# Patient Record
Sex: Male | Born: 2000 | Race: Asian | Hispanic: No | Marital: Single | Smoking: Current some day smoker
Health system: Southern US, Community
[De-identification: ages and names within clinical notes are randomized; demographics above are authoritative.]

## PROBLEM LIST (undated history)

## (undated) ENCOUNTER — Ambulatory Visit (HOSPITAL_COMMUNITY): Payer: Self-pay | Source: Home / Self Care

---
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
## 2020-10-14 IMAGING — US ULTRASOUND SCROTUM DOPPLER COMPLETE
1 series · 14 of 25 positions shown · non-contrast
Comparison: None.

CLINICAL DATA: Right-sided testicular pain

EXAM:
SCROTAL ULTRASOUND
DOPPLER ULTRASOUND OF THE TESTICLES
TECHNIQUE: Complete ultrasound examination of the testicles, epididymis, and
other scrotal structures was performed. Color and spectral Doppler
ultrasound were also utilized to evaluate blood flow to the
testicles.

[Series 1: ultrasound scrotum doppler complete · 14 of 39 slices shown]
[im 1/39]
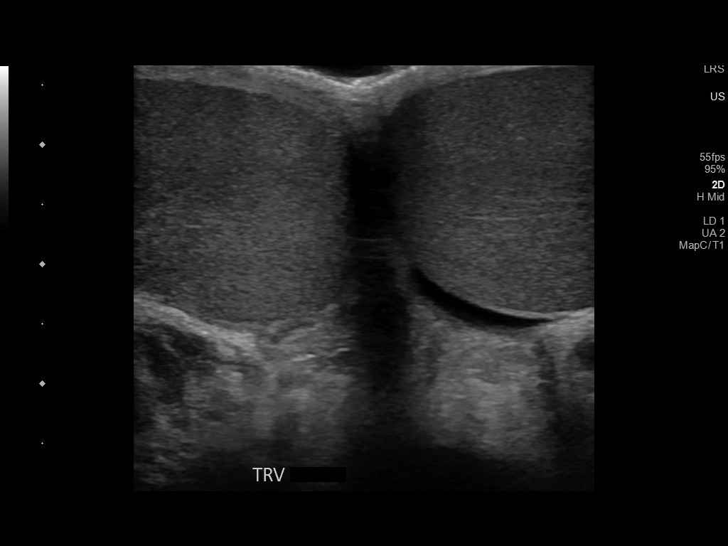
[im 4/39]
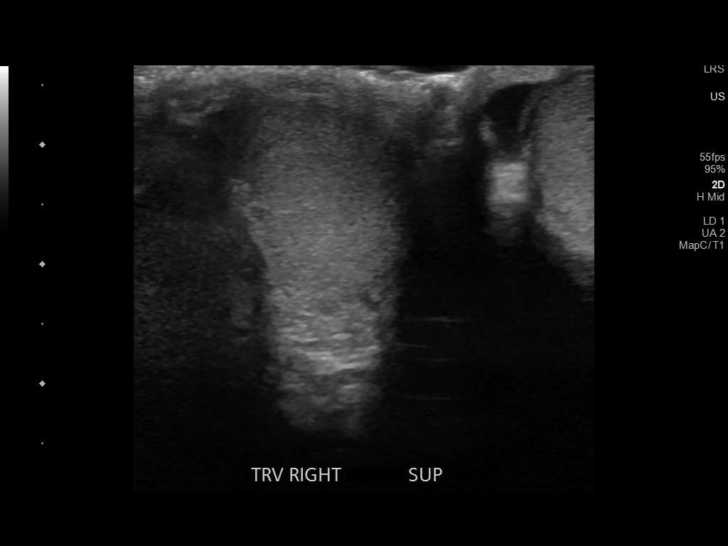
[im 7/39]
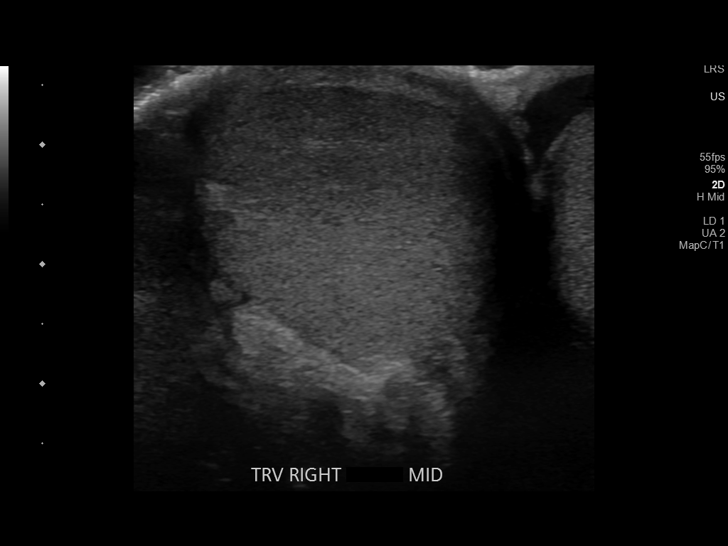
[im 10/39]
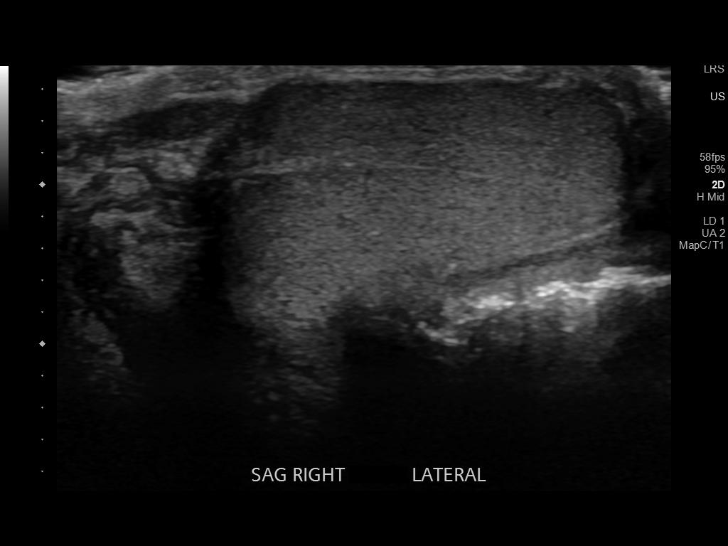
[im 13/39]
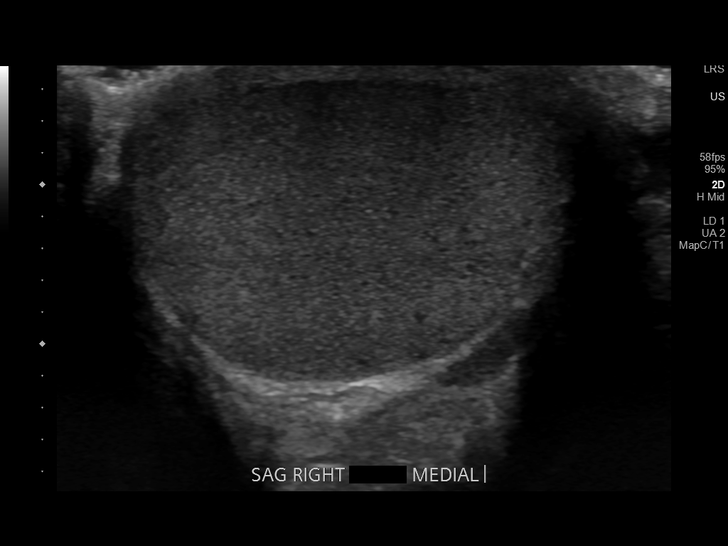
[im 15/39]
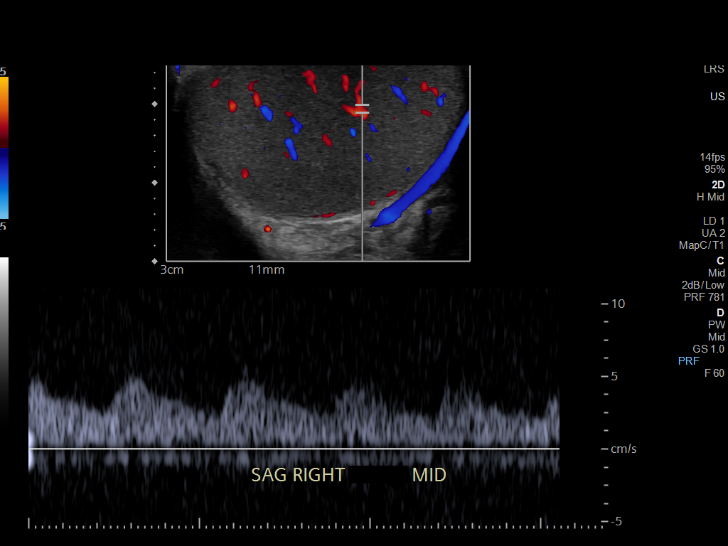
[im 18/39]
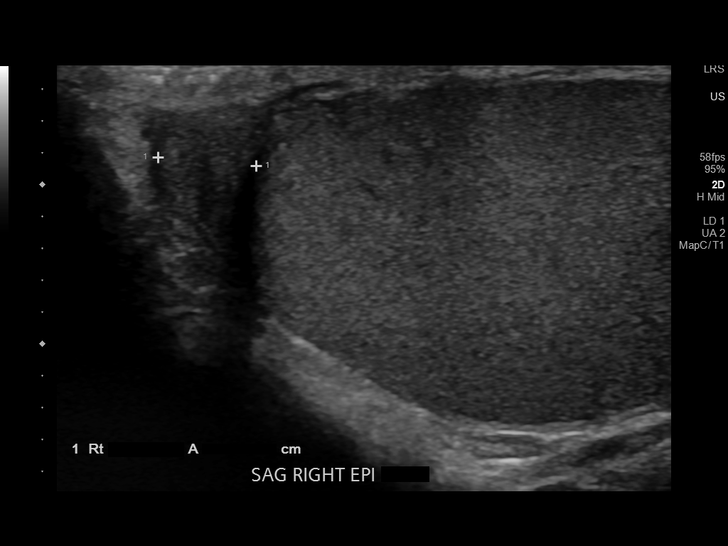
[im 21/39]
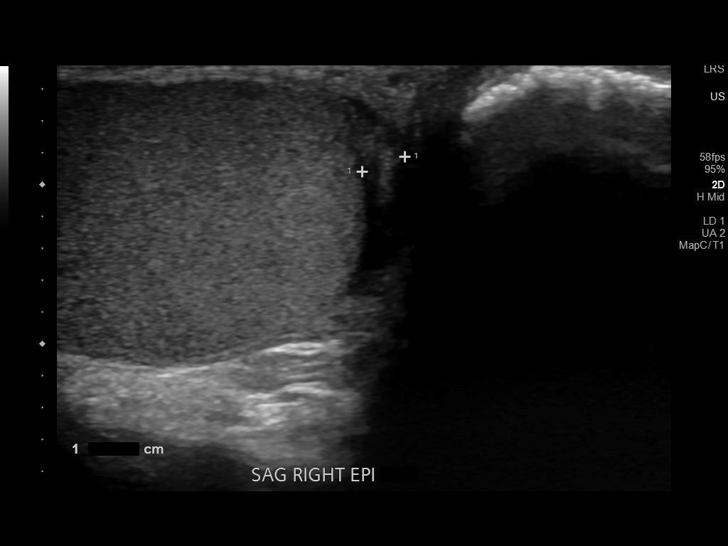
[im 24/39]
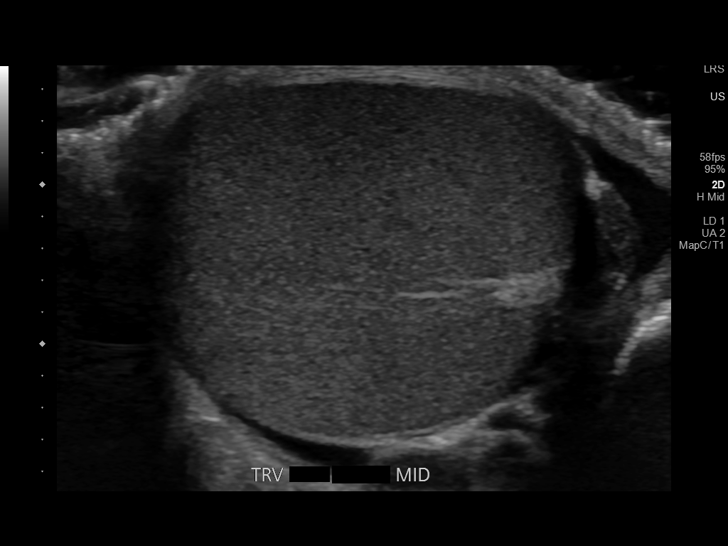
[im 26/39]
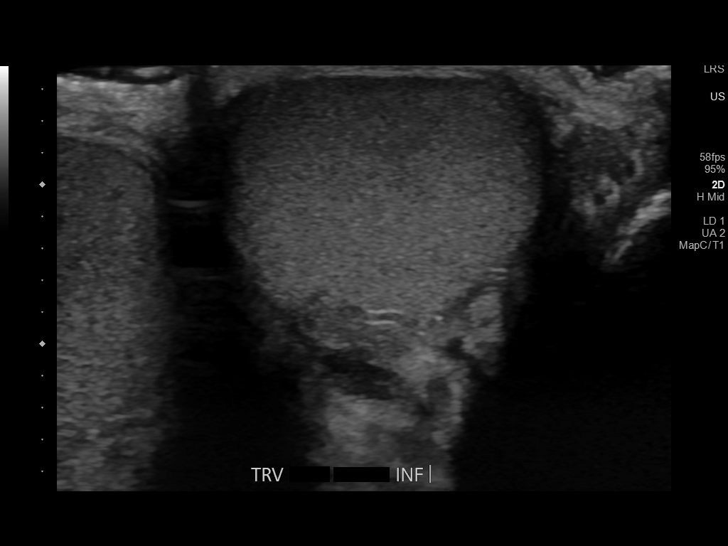
[im 29/39]
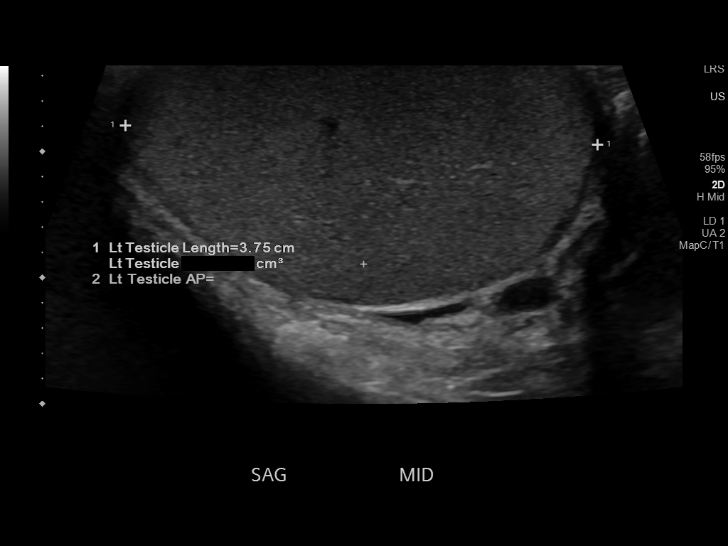
[im 32/39]
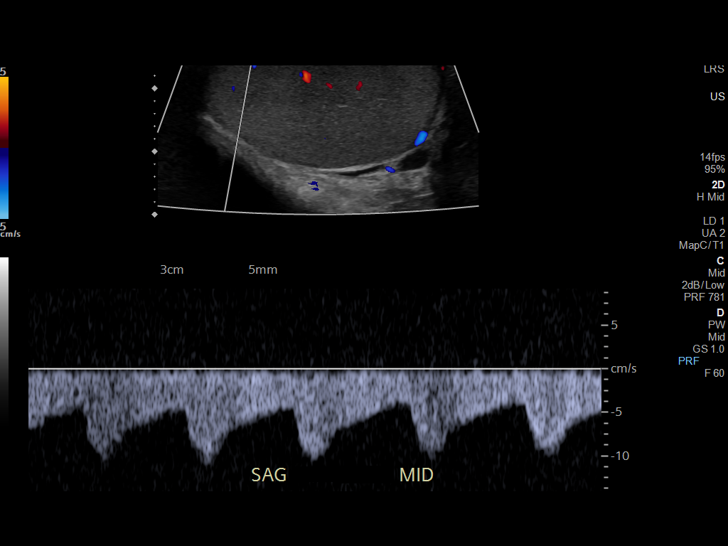
[im 35/39]
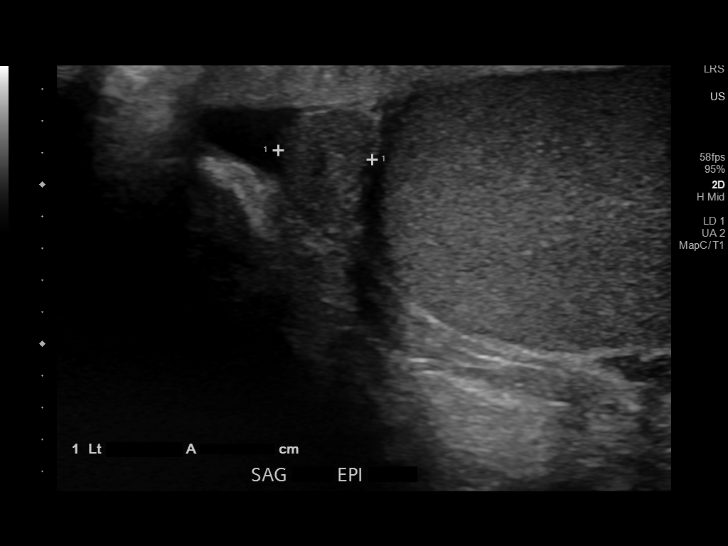
[im 39/39]
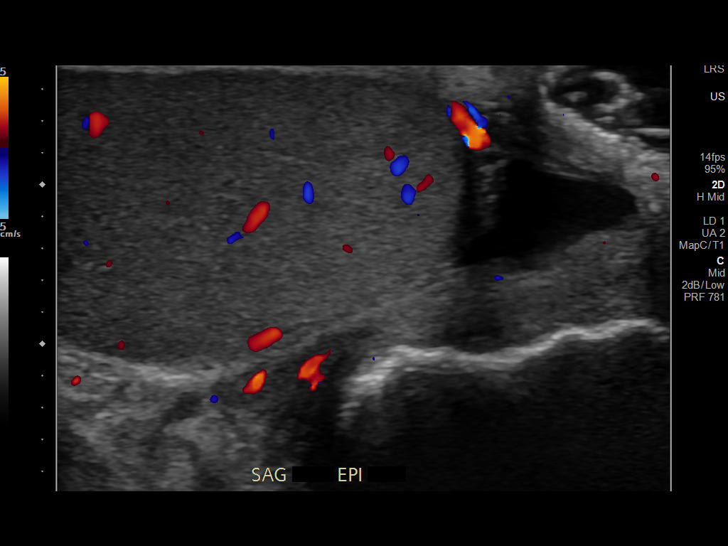

[14 of 25 positions shown; findings below may reference images not displayed]

FINDINGS: Right testicle

Measurements: 3.5 x 2.4 x 2.5 cm. No mass or microlithiasis
visualized.

Left testicle

Measurements: 3.8 x 2.3 x 2.6 cm. No mass or microlithiasis
visualized.

Right epididymis:  Normal in size and appearance.

Left epididymis:  Normal in size and appearance.

Hydrocele:  None visualized.

Varicocele:  None visualized.

Pulsed Doppler interrogation of both testes demonstrates normal low
resistance arterial and venous waveforms bilaterally.
IMPRESSION: Normal study.
